# Patient Record
Sex: Female | Born: 2000
Health system: Southern US, Community
[De-identification: ages and names within clinical notes are randomized; demographics above are authoritative.]

---
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
## 2021-03-27 DIAGNOSIS — Z03818 Encounter for observation for suspected exposure to other biological agents ruled out: Secondary | ICD-10-CM | POA: Diagnosis not present

## 2021-03-27 DIAGNOSIS — Z113 Encounter for screening for infections with a predominantly sexual mode of transmission: Secondary | ICD-10-CM | POA: Diagnosis not present

## 2021-03-27 DIAGNOSIS — J039 Acute tonsillitis, unspecified: Secondary | ICD-10-CM | POA: Diagnosis not present

## 2021-03-27 DIAGNOSIS — R69 Illness, unspecified: Secondary | ICD-10-CM | POA: Diagnosis not present

## 2021-03-27 DIAGNOSIS — J029 Acute pharyngitis, unspecified: Secondary | ICD-10-CM | POA: Diagnosis not present

## 2021-05-15 DIAGNOSIS — Z03818 Encounter for observation for suspected exposure to other biological agents ruled out: Secondary | ICD-10-CM | POA: Diagnosis not present

## 2021-05-15 DIAGNOSIS — J039 Acute tonsillitis, unspecified: Secondary | ICD-10-CM | POA: Diagnosis not present

## 2021-05-15 DIAGNOSIS — J36 Peritonsillar abscess: Secondary | ICD-10-CM | POA: Diagnosis not present

## 2021-05-15 DIAGNOSIS — J029 Acute pharyngitis, unspecified: Secondary | ICD-10-CM | POA: Diagnosis not present

## 2021-05-16 DIAGNOSIS — J039 Acute tonsillitis, unspecified: Secondary | ICD-10-CM | POA: Diagnosis not present

## 2021-05-30 DIAGNOSIS — J039 Acute tonsillitis, unspecified: Secondary | ICD-10-CM | POA: Diagnosis not present

## 2021-06-13 DIAGNOSIS — J039 Acute tonsillitis, unspecified: Secondary | ICD-10-CM | POA: Diagnosis not present

## 2023-03-03 DIAGNOSIS — Z23 Encounter for immunization: Secondary | ICD-10-CM | POA: Diagnosis not present

## 2023-03-03 DIAGNOSIS — Z01419 Encounter for gynecological examination (general) (routine) without abnormal findings: Secondary | ICD-10-CM | POA: Diagnosis not present

## 2023-03-03 DIAGNOSIS — Z124 Encounter for screening for malignant neoplasm of cervix: Secondary | ICD-10-CM | POA: Diagnosis not present

## 2023-04-24 DIAGNOSIS — L920 Granuloma annulare: Secondary | ICD-10-CM | POA: Diagnosis not present
